# Patient Record
Sex: Female | Born: 2005 | Race: White | Hispanic: No | Marital: Single | Smoking: Never smoker
Health system: Southern US, Community
[De-identification: ages and names within clinical notes are randomized; demographics above are authoritative.]

## PROBLEM LIST (undated history)

## (undated) DIAGNOSIS — L309 Dermatitis, unspecified: Secondary | ICD-10-CM

## (undated) HISTORY — DX: Dermatitis, unspecified: L30.9

---
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
## 2025-01-09 NOTE — Progress Notes (Unsigned)
 No chief complaint on file.   Subjective  History was provided by ***   Anita Robertson is a 19 y.o. female who is here for this wellness visit.     Current Issues: Current concerns include:{Current Issues, list:21476}   She was last seen in November with concern about mold in her bedroom air vent, and complaints of headaches, congestion and fatigue x 2 months. Exam was consistent with allergies, and she was advised to take a daily antihistamine, consider nasal saline or sinus rinses. She was advised that if fatigue persisted, we may need to check some bloodwork.  Anxiety and depression-- Very anxious about going to doctors, shots and labs. She was previously prescribed hydroxyzine  to take prior to visits, but this wasn't helpful. She gets clammy, and vomits related to doctors visits, procedures. In 10/2024 she reported seeing therapist, who suggested talking to us  about propranolol  and zofran . We discussed the use of beta blockers prior to stressful events (she declined), and sent in a prescription for zofran  to use for nausea/vomiting.    Heavy menses and cramps--had virtual visit 08/08/24, patient requesting OCP's to help with symptoms. Menses were monthly, regular, heavy x 4 days, lasting for about a week. We had discussed using Slynd  (progesterone-only pill), rather than combined OCP related to her h/o migraines with aura. This wasn't covered. She was started on Micronor  in 09/2024. In November she reported her periods were lighter, but lasting a little longer.     H/o migraines, last bad one was in 01/2024. Has reported aura in the past with her migraines. Previously took topamax  sprinkles, stopped when HA's improved. Uses Maxalt  prn with good results.  ***UPDATE MIGRAINES   Acne--well controlled on clindamycin  swabs        Family Relationships: {CHL AMB PED FAM [phone]} Grades: {CHL AMB PED HMJIZD:7899999945} School: {CHL AMB PED SCHOOL  #2:7476952949} Future Plans: {CHL AMB PED FUTURE EOJWD:7899999932}   Sports: {CHL AMB PED DENMUD:7899999942} Exercise: {YES/NO AS:20300} Activities: {CHL AMB PED ACTIVITIES:[phone]} Friends: {YES/NO AS:20300}   Auto: {CHL AMB PED AUTO:[phone]} Bike: {CHL AMB PED BIKE:[phone]} Safety: {CHL AMB PED SAFETY:[phone]}   Diet: {CHL AMB PED IPZU:7899999937} Risky eating habits: {CHL AMB PED EATING HABITS:[phone]} Intake: {CHL AMB PED INTAKE:[phone]} Body Image: {CHL AMB PED BODY IMAGE:8044326272}   Tobacco: {YES/NO AS:20300} Alcohol: {YES/NO AS:20300} Drugs: {YES/NO AS:20300}   Sexual Activity: {CHL AMB PED DZK:7899999931}   Suicide Risk Emotions: {CHL AMB PED EMOTIONS:[phone]} Depression: {CHL AMB PED [phone]} Suicidal: {CHL AMB PED SUICIDAL:[phone]}    12/31/2024    8:29 PM 03/30/2021    2:30 PM  Depression screen PHQ 2/9  Decreased Interest 0   Down, Depressed, Hopeless 0   PHQ - 2 Score 0   Altered sleeping    Tired, decreased energy    Change in appetite    Feeling bad or failure about yourself     Trouble concentrating    Moving slowly or fidgety/restless    Suicidal thoughts    PHQ-9 Score    Difficult doing work/chores       Information is confidential and restricted. Go to Review Flowsheets to unlock data.       No data to display          Immunization History  Administered Date(s) Administered   DTaP 01/09/2007, 03/10/2007, 05/11/2007, 05/10/2008, 12/10/2010   HIB (PRP-OMP) 01/09/2007, 03/10/2007, 05/11/2007, 11/11/2008   HPV 9-valent 12/14/2018, 07/24/2019   Hepatitis A 11/10/2007, 11/11/2008   Hepatitis B 01/09/2007, 08/11/2007, 11/10/2007   IPV 01/09/2007,  03/10/2007, 08/11/2007, 12/10/2010   MMR 12/10/2006, 11/10/2007   MenQuadfi_Meningococcal Groups ACYW Conjugate 08/28/2024   Meningococcal Conjugate 11/22/2017   PFIZER(Purple Top)SARS-COV-2 Vaccination 06/26/2020, 07/25/2020   Pneumococcal Conjugate-13  11/27/2009   Pneumococcal-Unspecified 01/09/2007, 03/10/2007, 05/11/2007, 11/10/2007   Rotavirus Pentavalent 01/09/2007, 03/10/2007, 05/11/2007   Td 11/22/2017   Varicella 05/10/2008, 12/10/2010     PMH, PSH, SH and FH were reviewed and updated     Review of Systems  Constitutional: Negative.  Negative for chills, fever and weight loss.  HENT:  Negative for congestion and hearing loss.   Eyes: Negative.   Respiratory:  Negative for cough, shortness of breath and wheezing.   Cardiovascular:  Negative for chest pain, palpitations and leg swelling.  Gastrointestinal:  Negative for abdominal pain, blood in stool, constipation, diarrhea, melena, nausea and vomiting.  Genitourinary:  Negative for dysuria, frequency, hematuria and urgency.  Musculoskeletal:  Negative for joint pain.  Skin:  Negative for rash.  Neurological:  Negative for dizziness, tingling, tremors, weakness and headaches.  Endo/Heme/Allergies:  Negative for environmental allergies. Does not bruise/bleed easily.  Psychiatric/Behavioral:  Negative for depression and memory loss. The patient is not nervous/anxious and does not have insomnia.       ***UPDATE ALL     Objective:    Objective  There were no vitals taken for this visit.  Wt Readings from Last 3 Encounters:  10/25/24 118 lb 6.4 oz (53.7 kg) (39%, Z= -0.29)*  08/08/24 115 lb (52.2 kg) (32%, Z= -0.46)*  06/06/24 120 lb (54.4 kg) (44%, Z= -0.15)*   * Growth percentiles are based on CDC (Girls, 2-20 Years) data.      ***vision   General:   {general exam:16600}  Gait:   {normal/abnormal***:16604::normal}  Skin:   {skin brief exam:104}  Oral cavity:   {oropharynx exam:17160::lips, mucosa, and tongue normal; teeth and gums normal}  Eyes:   {eye peds:16765}  Ears:   {ear tm:14360}  Neck:   {Exam; neck peds:13798}  Lungs:  {lung exam:16931}  Heart:   {heart exam:5510}  Abdomen:  {abdomen exam:16834}  GU:  {genital exam:16857}  Extremities:    {extremity exam:5109}  Neuro:  {exam; neuro:5902::normal without focal findings,mental status, speech normal, alert and oriented x3,PERLA,reflexes normal and symmetric}            Assessment and Plan:    Assessment Healthy 19 y.o. female child.   Anticipatory guidance discussed. {guidance discussed, list:[phone]}   Phq-9 and gad-7  Vision exam Hearing only if abnormal screen (well child questionnaire)  Bexsero #1 (or whatever Men B vaccine we now have), and set up NV for 2nd one Flu shot?--offer/decline Offer/decline COVID booster  Can return for NV for vaccines if prefers (should get menB before college, requires 2 doses, 6 months apart for Bexsero)   Labs ARE recommended--can return another day for fasting labs CBC, lipids Urine chlamydia and GC (if ever sexually active) Consider HIV, RPR, Hep C Ab, and Hep B sAb and sAg, if has been sexually active   ?if needs maxalt  refilled (last rx'd 09/2023)

## 2025-01-10 ENCOUNTER — Ambulatory Visit: Payer: PRIVATE HEALTH INSURANCE | Admitting: Family Medicine

## 2025-01-10 ENCOUNTER — Encounter: Payer: Self-pay | Admitting: Family Medicine

## 2025-01-10 VITALS — BP 120/70 | HR 100 | Ht 63.0 in | Wt 117.4 lb

## 2025-01-10 DIAGNOSIS — J309 Allergic rhinitis, unspecified: Secondary | ICD-10-CM

## 2025-01-10 DIAGNOSIS — Z0000 Encounter for general adult medical examination without abnormal findings: Secondary | ICD-10-CM | POA: Diagnosis not present

## 2025-01-10 DIAGNOSIS — Z7185 Encounter for immunization safety counseling: Secondary | ICD-10-CM

## 2025-01-10 DIAGNOSIS — F419 Anxiety disorder, unspecified: Secondary | ICD-10-CM

## 2025-01-10 DIAGNOSIS — G43109 Migraine with aura, not intractable, without status migrainosus: Secondary | ICD-10-CM

## 2025-01-10 DIAGNOSIS — L700 Acne vulgaris: Secondary | ICD-10-CM

## 2025-01-10 DIAGNOSIS — N946 Dysmenorrhea, unspecified: Secondary | ICD-10-CM

## 2025-01-10 MED ORDER — PROPRANOLOL HCL 20 MG PO TABS: ORAL_TABLET | ORAL | 0 refills | Status: AC

## 2025-01-10 MED ORDER — NORETHINDRONE 0.35 MG PO TABS: 1.0000 | ORAL_TABLET | Freq: Every day | ORAL | 3 refills | Status: AC

## 2025-01-10 MED ORDER — CLINDAMYCIN PHOSPHATE 1 % EX SWAB: CUTANEOUS | 0 refills | Status: AC

## 2025-01-10 NOTE — Patient Instructions (Signed)
 We discussed doing blood tests to check for anemia and do a cholesterol test. You can see if you can get this done when you come for your Meningitis vaccine (Meningitis B series, Bexsero). Take the propanolol 30-60 minutes prior to coming, and bring the ondansetron  (zofran --nausea medication) with you, to have on hand, if needed.

## 2026-01-15 ENCOUNTER — Encounter: Admitting: Family Medicine
